# Patient Record
Sex: Male | Born: 1990 | Race: White | Hispanic: No | Marital: Single | Smoking: Never smoker
Health system: Southern US, Community
[De-identification: ages and names within clinical notes are randomized; demographics above are authoritative.]

## PROBLEM LIST (undated history)

## (undated) DIAGNOSIS — T7840XA Allergy, unspecified, initial encounter: Secondary | ICD-10-CM

## (undated) HISTORY — DX: Allergy, unspecified, initial encounter: T78.40XA

---

## 2013-05-23 ENCOUNTER — Ambulatory Visit: Payer: 59 | Admitting: Emergency Medicine

## 2013-05-23 VITALS — BP 100/66 | HR 97 | Temp 97.6°F | Resp 18 | Ht 74.75 in | Wt 144.0 lb

## 2013-05-23 DIAGNOSIS — L02219 Cutaneous abscess of trunk, unspecified: Secondary | ICD-10-CM

## 2013-05-23 DIAGNOSIS — L03319 Cellulitis of trunk, unspecified: Secondary | ICD-10-CM

## 2013-05-23 MED ORDER — SULFAMETHOXAZOLE-TRIMETHOPRIM 800-160 MG PO TABS: 1.0000 | ORAL_TABLET | Freq: Two times a day (BID) | ORAL | Status: DC

## 2013-05-23 NOTE — Progress Notes (Signed)
[facility] [address] [place] [address] [phone]  Date:  05/23/2013   Name:  William Gibbs   DOB:  12-Nov-1990   MRN:  [id number]  PCP:  No PCP Per Patient    Chief Complaint: Insect Bite   History of Present Illness:  William Gibbs is a 22 y.o. very pleasant male patient who presents with the following:  Bitten by an unknown insect on the left shoulder that was pruritic.  He noticed today some red streaks leading away from the bite.  No fever or chills.  No pain or tenderness.  No improvement with over the counter medications or other home remedies. Denies other complaint or health concern today.   There are no active problems to display for this patient.   Past Medical History  Diagnosis Date  . Allergy     History reviewed. No pertinent past surgical history.  History  Substance Use Topics  . Smoking status: Never Smoker   . Smokeless tobacco: Not on file  . Alcohol Use: No    History reviewed. No pertinent family history.  No Known Allergies  Medication list has been reviewed and updated.  No current outpatient prescriptions on file prior to visit.   No current facility-administered medications on file prior to visit.    Review of Systems:  As per HPI, otherwise negative.    Physical Examination: Filed Vitals:   05/23/13 0941  BP: 100/66  Pulse: 97  Temp: 97.6 F (36.4 C)  Resp: 18   Filed Vitals:   05/23/13 0941  Height: 6' 2.75" (1.899 m)  Weight: 144 lb (65.318 kg)   Body mass index is 18.11 kg/(m^2). Ideal Body Weight: Weight in (lb) to have BMI = 25: 198.3   GEN: WDWN, NAD, Non-toxic, Alert & Oriented x 3 HEENT: Atraumatic, Normocephalic.  Ears and Nose: No external deformity. EXTR: No clubbing/cyanosis/edema NEURO: Normal gait.  PSYCH: Normally interactive. Conversant. Not depressed or anxious appearing.  Calm demeanor.  SKIN:  Lymphangitis from erythematous tender mass on anterior left  shoulder.  Assessment and Plan: Cellulitis Septra ds Local heat  Signed,  Phillips Odor, MD

## 2013-05-23 NOTE — Patient Instructions (Signed)
Cellulitis Cellulitis is an infection of the skin and the tissue beneath it. The infected area is usually red and tender. Cellulitis occurs most often in the arms and lower legs.  CAUSES  Cellulitis is caused by bacteria that enter the skin through cracks or cuts in the skin. The most common types of bacteria that cause cellulitis are Staphylococcus and Streptococcus. SYMPTOMS   Redness and warmth.  Swelling.  Tenderness or pain.  Fever. DIAGNOSIS  Your caregiver can usually determine what is wrong based on a physical exam. Blood tests may also be done. TREATMENT  Treatment usually involves taking an antibiotic medicine. HOME CARE INSTRUCTIONS   Take your antibiotics as directed. Finish them even if you start to feel better.  Keep the infected arm or leg elevated to reduce swelling.  Apply a warm cloth to the affected area up to 4 times per day to relieve pain.  Only take over-the-counter or prescription medicines for pain, discomfort, or fever as directed by your caregiver.  Keep all follow-up appointments as directed by your caregiver. SEEK MEDICAL CARE IF:   You notice red streaks coming from the infected area.  Your red area gets larger or turns dark in color.  Your bone or joint underneath the infected area becomes painful after the skin has healed.  Your infection returns in the same area or another area.  You notice a swollen bump in the infected area.  You develop new symptoms. SEEK IMMEDIATE MEDICAL CARE IF:   You have a fever.  You feel very sleepy.  You develop vomiting or diarrhea.  You have a general ill feeling (malaise) with muscle aches and pains. MAKE SURE YOU:   Understand these instructions.  Will watch your condition.  Will get help right away if you are not doing well or get worse. Document Released: 07/14/2005 Document Revised: 04/04/2012 Document Reviewed: 12/20/2011 [employer] Patient Information 2014 [employer].  

## 2014-06-13 ENCOUNTER — Other Ambulatory Visit: Payer: Self-pay | Admitting: Orthopaedic Surgery

## 2014-06-13 DIAGNOSIS — M25532 Pain in left wrist: Secondary | ICD-10-CM

## 2014-10-18 ENCOUNTER — Emergency Department (HOSPITAL_COMMUNITY)
Admission: EM | Admit: 2014-10-18 | Discharge: 2014-10-18 | Disposition: A | Discharge status: Home / Self Care | Payer: 59 | Source: Home / Self Care | Attending: Emergency Medicine | Admitting: Emergency Medicine

## 2014-10-18 ENCOUNTER — Encounter (HOSPITAL_COMMUNITY): Payer: Self-pay | Admitting: Emergency Medicine

## 2014-10-18 DIAGNOSIS — J111 Influenza due to unidentified influenza virus with other respiratory manifestations: Secondary | ICD-10-CM

## 2014-10-18 DIAGNOSIS — R69 Illness, unspecified: Principal | ICD-10-CM

## 2014-10-18 MED ORDER — HYDROCOD POLST-CHLORPHEN POLST 10-8 MG/5ML PO LQCR: 5.0000 mL | Freq: Two times a day (BID) | ORAL | Status: DC | PRN

## 2014-10-18 MED ORDER — ALBUTEROL SULFATE HFA 108 (90 BASE) MCG/ACT IN AERS
2.0000 | INHALATION_SPRAY | Freq: Four times a day (QID) | RESPIRATORY_TRACT | Status: AC
Start: 2014-10-18 — End: ?

## 2014-10-18 MED ORDER — OSELTAMIVIR PHOSPHATE 75 MG PO CAPS: 75.0000 mg | ORAL_CAPSULE | Freq: Two times a day (BID) | ORAL | Status: DC

## 2014-10-18 MED ORDER — ALBUTEROL SULFATE HFA 108 (90 BASE) MCG/ACT IN AERS
2.0000 | INHALATION_SPRAY | Freq: Four times a day (QID) | RESPIRATORY_TRACT | Status: DC
Start: 2014-10-18 — End: 2014-10-18

## 2014-10-18 NOTE — ED Provider Notes (Signed)
   Chief Complaint   URI   History of Present Illness   William Gibbs is a 24 year old male who has had a four-day history beginning with malaise and fatigue. Then he developed a cough that was first dry then productive of yellow sputum. He's had some wheezing, fever to 101, chills, myalgias, sweats, sore throat, rhinorrhea, diarrhea and nausea and vomiting. He was exposed to a family member had something similar. He's not gotten the flu vaccine this year.  Review of Systems   Other than as noted above, the patient denies any of the following symptoms: Systemic:  No fevers, chills, sweats, or myalgias. Eye:  No redness or discharge. ENT:  No ear pain, headache, nasal congestion, drainage, sinus pressure, or sore throat. Neck:  No neck pain, stiffness, or swollen glands. Lungs:  No cough, sputum production, hemoptysis, wheezing, chest tightness, shortness of breath or chest pain. GI:  No abdominal pain, nausea, vomiting or diarrhea.  PMFSH   Past medical history, family history, social history, meds, and allergies were reviewed.   Physical exam   Vital signs:  BP 112/78 mmHg  Pulse 144  Temp(Src) 100.7 F (38.2 C) (Oral)  Resp 16  SpO2 99% General:  Alert and oriented.  In no distress.  Skin warm and dry. Eye:  No conjunctival injection or drainage. Lids were normal. ENT:  TMs and canals were normal, without erythema or inflammation.  Nasal mucosa was clear and uncongested, without drainage.  Mucous membranes were moist.  Pharynx was clear with no exudate or drainage.  There were no oral ulcerations or lesions. Neck:  Supple, no adenopathy, tenderness or mass. Lungs:  No respiratory distress.  There are scattered, bilateral inspiratory wheezes, no expiratory wheezes, no rales or rhonchi, good air movement bilaterally.  Heart:  Regular rhythm, without gallops, murmers or rubs. Skin:  Clear, warm, and dry, without rash or lesions.  Assessment     The encounter diagnosis was  Influenza-like illness.  There is no evidence of pneumonia, strep throat, sinusitis, otitis media.    Plan    1.  Meds:  The following meds were prescribed:   New Prescriptions   ALBUTEROL (PROVENTIL HFA;VENTOLIN HFA) 108 (90 BASE) MCG/ACT INHALER    Inhale 2 puffs into the lungs 4 (four) times daily.   CHLORPHENIRAMINE-HYDROCODONE (TUSSIONEX) 10-8 MG/5ML LQCR    Take 5 mLs by mouth every 12 (twelve) hours as needed for cough.   OSELTAMIVIR (TAMIFLU) 75 MG CAPSULE    Take 1 capsule (75 mg total) by mouth every 12 (twelve) hours.    2.  Patient Education/Counseling:  The patient was given appropriate handouts, self care instructions, and instructed in symptomatic relief.  Instructed to get extra fluids and extra rest.    3.  Follow up:  The patient was told to follow up here if no better in 3 to 4 days, or sooner if becoming worse in any way, and given some red flag symptoms such as increasing fever, difficulty breathing, chest pain, or persistent vomiting which would prompt immediate return.       Reuben Likes, MD 10/18/14 [phone]

## 2014-10-18 NOTE — Discharge Instructions (Signed)
Influenza Influenza ("the flu") is a viral infection of the respiratory tract. It occurs more often in winter months because people spend more time in close contact with one another. Influenza can make you feel very sick. Influenza easily spreads from person to person (contagious). CAUSES  Influenza is caused by a virus that infects the respiratory tract. You can catch the virus by breathing in droplets from an infected person's cough or sneeze. You can also catch the virus by touching something that was recently contaminated with the virus and then touching your mouth, nose, or eyes. RISKS AND COMPLICATIONS You may be at risk for a more severe case of influenza if you smoke cigarettes, have diabetes, have chronic heart disease (such as heart failure) or lung disease (such as asthma), or if you have a weakened immune system. Elderly people and pregnant women are also at risk for more serious infections. The most common problem of influenza is a lung infection (pneumonia). Sometimes, this problem can require emergency medical care and may be life threatening. SIGNS AND SYMPTOMS  Symptoms typically last 4 to 10 days and may include: 1. Fever. 2. Chills. 3. Headache, body aches, and muscle aches. 4. Sore throat. 5. Chest discomfort and cough. 6. Poor appetite. 7. Weakness or feeling tired. 8. Dizziness. 9. Nausea or vomiting. DIAGNOSIS  Diagnosis of influenza is often made based on your history and a physical exam. A nose or throat swab test can be done to confirm the diagnosis. TREATMENT  In mild cases, influenza goes away on its own. Treatment is directed at relieving symptoms. For more severe cases, your health care provider may prescribe antiviral medicines to shorten the sickness. Antibiotic medicines are not effective because the infection is caused by a virus, not by bacteria. HOME CARE INSTRUCTIONS 1. Take medicines only as directed by your health care provider. 2. Use a cool mist  humidifier to make breathing easier. 3. Get plenty of rest until your temperature returns to normal. This usually takes 3 to 4 days. 4. Drink enough fluid to keep your urine clear or pale yellow. 5. Cover yourmouth and nosewhen coughing or sneezing,and wash your handswellto prevent thevirusfrom spreading. 6. Stay homefromwork orschool untilthe fever is gonefor at least 23full day. PREVENTION  An annual influenza vaccination (flu shot) is the best way to avoid getting influenza. An annual flu shot is now routinely recommended for all adults in the [place] SEEK MEDICAL CARE IF:  You experiencechest pain, yourcough worsens,or you producemore mucus.  Youhave nausea,vomiting, ordiarrhea.  Your fever returns or gets worse. SEEK IMMEDIATE MEDICAL CARE IF:  You havetrouble breathing, you become short of breath,or your skin ornails becomebluish.  You have severe painor stiffnessin the neck.  You develop a sudden headache, or pain in the face or ear.  You have nausea or vomiting that you cannot control. MAKE SURE YOU:   Understand these instructions.  Will watch your condition.  Will get help right away if you are not doing well or get worse. Document Released: 10/01/2000 Document Revised: 02/18/2014 Document Reviewed: 01/03/2012 [facility] Patient Information 2015 [place], [place]. This information is not intended to replace advice given to you by your health care provider. Make sure you discuss any questions you have with your health care provider.    How to Use an Inhaler Proper inhaler technique is very important. Good technique ensures that the medicine reaches the lungs. Poor technique results in depositing the medicine on the tongue and back of the throat rather than in  the airways. If you do not use the inhaler with good technique, the medicine will not help you. STEPS TO FOLLOW IF USING AN INHALER WITHOUT AN EXTENSION TUBE 10. Remove the cap from the  inhaler. 11. If you are using the inhaler for the first time, you will need to prime it. Shake the inhaler for 5 seconds and release four puffs into the air, away from your face. Ask your health care provider or pharmacist if you have questions about priming your inhaler. 12. Shake the inhaler for 5 seconds before each breath in (inhalation). 13. Position the inhaler so that the top of the canister faces up. 14. Put your index finger on the top of the medicine canister. Your thumb supports the bottom of the inhaler. 15. Open your mouth. 16. Either place the inhaler between your teeth and place your lips tightly around the mouthpiece, or hold the inhaler 1-2 inches away from your open mouth. If you are unsure of which technique to use, ask your health care provider. 17. Breathe out (exhale) normally and as completely as possible. 18. Press the canister down with your index finger to release the medicine. 19. At the same time as the canister is pressed, inhale deeply and slowly until your lungs are completely filled. This should take 4-6 seconds. Keep your tongue down. 20. Hold the medicine in your lungs for 5-10 seconds (10 seconds is best). This helps the medicine get into the small airways of your lungs. 21. Breathe out slowly, through pursed lips. Whistling is an example of pursed lips. 22. Wait at least 15-30 seconds between puffs. Continue with the above steps until you have taken the number of puffs your health care provider has ordered. Do not use the inhaler more than your health care provider tells you. 23. Replace the cap on the inhaler. 24. Follow the directions from your health care provider or the inhaler insert for cleaning the inhaler. STEPS TO FOLLOW IF USING AN INHALER WITH AN EXTENSION (SPACER) 7. Remove the cap from the inhaler. 8. If you are using the inhaler for the first time, you will need to prime it. Shake the inhaler for 5 seconds and release four puffs into the air, away  from your face. Ask your health care provider or pharmacist if you have questions about priming your inhaler. 9. Shake the inhaler for 5 seconds before each breath in (inhalation). 10. Place the open end of the spacer onto the mouthpiece of the inhaler. 11. Position the inhaler so that the top of the canister faces up and the spacer mouthpiece faces you. 12. Put your index finger on the top of the medicine canister. Your thumb supports the bottom of the inhaler and the spacer. 13. Breathe out (exhale) normally and as completely as possible. 14. Immediately after exhaling, place the spacer between your teeth and into your mouth. Close your lips tightly around the spacer. 15. Press the canister down with your index finger to release the medicine. 16. At the same time as the canister is pressed, inhale deeply and slowly until your lungs are completely filled. This should take 4-6 seconds. Keep your tongue down and out of the way. 17. Hold the medicine in your lungs for 5-10 seconds (10 seconds is best). This helps the medicine get into the small airways of your lungs. Exhale. 18. Repeat inhaling deeply through the spacer mouthpiece. Again hold that breath for up to 10 seconds (10 seconds is best). Exhale slowly. If it is difficult  to take this second deep breath through the spacer, breathe normally several times through the spacer. Remove the spacer from your mouth. 19. Wait at least 15-30 seconds between puffs. Continue with the above steps until you have taken the number of puffs your health care provider has ordered. Do not use the inhaler more than your health care provider tells you. 20. Remove the spacer from the inhaler, and place the cap on the inhaler. 21. Follow the directions from your health care provider or the inhaler insert for cleaning the inhaler and spacer. If you are using different kinds of inhalers, use your quick relief medicine to open the airways 10-15 minutes before using a  steroid if instructed to do so by your health care provider. If you are unsure which inhalers to use and the order of using them, ask your health care provider, nurse, or respiratory therapist. If you are using a steroid inhaler, always rinse your mouth with water after your last puff, then gargle and spit out the water. Do not swallow the water. AVOID:  Inhaling before or after starting the spray of medicine. It takes practice to coordinate your breathing with triggering the spray.  Inhaling through the nose (rather than the mouth) when triggering the spray. HOW TO DETERMINE IF YOUR INHALER IS FULL OR NEARLY EMPTY You cannot know when an inhaler is empty by shaking it. A few inhalers are now being made with dose counters. Ask your health care provider for a prescription that has a dose counter if you feel you need that extra help. If your inhaler does not have a counter, ask your health care provider to help you determine the date you need to refill your inhaler. Write the refill date on a calendar or your inhaler canister. Refill your inhaler 7-10 days before it runs out. Be sure to keep an adequate supply of medicine. This includes making sure it is not expired, and that you have a spare inhaler.  SEEK MEDICAL CARE IF:   Your symptoms are only partially relieved with your inhaler.  You are having trouble using your inhaler.  You have some increase in phlegm. SEEK IMMEDIATE MEDICAL CARE IF:   You feel little or no relief with your inhalers. You are still wheezing and are feeling shortness of breath or tightness in your chest or both.  You have dizziness, headaches, or a fast heart rate.  You have chills, fever, or night sweats.  You have a noticeable increase in phlegm production, or there is blood in the phlegm. MAKE SURE YOU:   Understand these instructions.  Will watch your condition.  Will get help right away if you are not doing well or get worse. Document Released: 10/01/2000  Document Revised: 07/25/2013 Document Reviewed: 05/03/2013 [facility] Patient Information 2015 [place], [place]. This information is not intended to replace advice given to you by your health care provider. Make sure you discuss any questions you have with your health care provider.

## 2014-10-18 NOTE — ED Notes (Signed)
C/o cold sx onset Tuesday Sx include: fatigue, productive cough, fevers, HA, BA, chills, vomiting due to cough, wheezing, diarrhea Alert, no signs of acute distress.

## 2014-10-20 ENCOUNTER — Ambulatory Visit (HOSPITAL_COMMUNITY): Payer: 59 | Attending: Emergency Medicine

## 2014-10-20 ENCOUNTER — Encounter (HOSPITAL_COMMUNITY): Payer: Self-pay

## 2014-10-20 ENCOUNTER — Emergency Department (INDEPENDENT_AMBULATORY_CARE_PROVIDER_SITE_OTHER)
Admission: EM | Admit: 2014-10-20 | Discharge: 2014-10-20 | Disposition: A | Discharge status: Home / Self Care | Payer: 59 | Source: Home / Self Care | Attending: Family Medicine | Admitting: Family Medicine

## 2014-10-20 ENCOUNTER — Emergency Department (HOSPITAL_COMMUNITY): Payer: 59

## 2014-10-20 DIAGNOSIS — R05 Cough: Secondary | ICD-10-CM | POA: Diagnosis not present

## 2014-10-20 DIAGNOSIS — R509 Fever, unspecified: Secondary | ICD-10-CM | POA: Diagnosis not present

## 2014-10-20 DIAGNOSIS — R079 Chest pain, unspecified: Secondary | ICD-10-CM

## 2014-10-20 DIAGNOSIS — J189 Pneumonia, unspecified organism: Secondary | ICD-10-CM

## 2014-10-20 DIAGNOSIS — R0600 Dyspnea, unspecified: Secondary | ICD-10-CM | POA: Diagnosis not present

## 2014-10-20 DIAGNOSIS — R000 Tachycardia, unspecified: Secondary | ICD-10-CM

## 2014-10-20 DIAGNOSIS — J181 Lobar pneumonia, unspecified organism: Principal | ICD-10-CM

## 2014-10-20 DIAGNOSIS — R0989 Other specified symptoms and signs involving the circulatory and respiratory systems: Secondary | ICD-10-CM | POA: Insufficient documentation

## 2014-10-20 MED ORDER — AZITHROMYCIN 250 MG PO TABS: 250.0000 mg | ORAL_TABLET | Freq: Every day | ORAL | Status: AC

## 2014-10-20 MED ORDER — LIDOCAINE HCL (PF) 1 % IJ SOLN
INTRAMUSCULAR | Status: AC
Start: 2014-10-20 — End: 2014-10-20
  Filled 2014-10-20: qty 5

## 2014-10-20 MED ORDER — IBUPROFEN 800 MG PO TABS
ORAL_TABLET | ORAL | Status: AC
  Filled 2014-10-20: qty 1

## 2014-10-20 MED ORDER — SODIUM CHLORIDE 0.9 % IV SOLN
Freq: Once | INTRAVENOUS | Status: AC
  Administered 2014-10-20: 14:00:00 via INTRAVENOUS

## 2014-10-20 MED ORDER — CEFTRIAXONE SODIUM 1 G IJ SOLR
1.0000 g | Freq: Once | INTRAMUSCULAR | Status: AC
  Administered 2014-10-20: 1 g via INTRAMUSCULAR

## 2014-10-20 MED ORDER — AMOXICILLIN-POT CLAVULANATE 875-125 MG PO TABS: 1.0000 | ORAL_TABLET | Freq: Two times a day (BID) | ORAL | Status: AC

## 2014-10-20 MED ORDER — CEFTRIAXONE SODIUM 1 G IJ SOLR
INTRAMUSCULAR | Status: AC
  Filled 2014-10-20: qty 10

## 2014-10-20 MED ORDER — IBUPROFEN 800 MG PO TABS
800.0000 mg | ORAL_TABLET | Freq: Once | ORAL | Status: AC
  Administered 2014-10-20: 800 mg via ORAL

## 2014-10-20 NOTE — Discharge Instructions (Signed)
Pneumonia °Pneumonia is an infection of the lungs.  °CAUSES °Pneumonia may be caused by bacteria or a virus. Usually, these infections are caused by breathing infectious particles into the lungs (respiratory tract). °SIGNS AND SYMPTOMS  °· Cough. °· Fever. °· Chest pain. °· Increased rate of breathing. °· Wheezing. °· Mucus production. °DIAGNOSIS  °If you have the common symptoms of pneumonia, your health care provider will typically confirm the diagnosis with a chest X-ray. The X-ray will show an abnormality in the lung (pulmonary infiltrate) if you have pneumonia. Other tests of your blood, urine, or sputum may be done to find the specific cause of your pneumonia. Your health care provider may also do tests (blood gases or pulse oximetry) to see how well your lungs are working. °TREATMENT  °Some forms of pneumonia may be spread to other people when you cough or sneeze. You may be asked to wear a mask before and during your exam. Pneumonia that is caused by bacteria is treated with antibiotic medicine. Pneumonia that is caused by the influenza virus may be treated with an antiviral medicine. Most other viral infections must run their course. These infections will not respond to antibiotics.  °HOME CARE INSTRUCTIONS  °· Cough suppressants may be used if you are losing too much rest. However, coughing protects you by clearing your lungs. You should avoid using cough suppressants if you can. °· Your health care provider may have prescribed medicine if he or she thinks your pneumonia is caused by bacteria or influenza. Finish your medicine even if you start to feel better. °· Your health care provider may also prescribe an expectorant. This loosens the mucus to be coughed up. °· Take medicines only as directed by your health care provider. °· Do not smoke. Smoking is a common cause of bronchitis and can contribute to pneumonia. If you are a smoker and continue to smoke, your cough may last several weeks after your  pneumonia has cleared. °· A cold steam vaporizer or humidifier in your room or home may help loosen mucus. °· Coughing is often worse at night. Sleeping in a semi-upright position in a recliner or using a couple pillows under your head will help with this. °· Get rest as you feel it is needed. Your body will usually let you know when you need to rest. °PREVENTION °A pneumococcal shot (vaccine) is available to prevent a common bacterial cause of pneumonia. This is usually suggested for: °· People over 65 years old. °· Patients on chemotherapy. °· People with chronic lung problems, such as bronchitis or emphysema. °· People with immune system problems. °If you are over 65 or have a high risk condition, you may receive the pneumococcal vaccine if you have not received it before. In some countries, a routine influenza vaccine is also recommended. This vaccine can help prevent some cases of pneumonia. You may be offered the influenza vaccine as part of your care. °If you smoke, it is time to quit. You may receive instructions on how to stop smoking. Your health care provider can provide medicines and counseling to help you quit. °SEEK MEDICAL CARE IF: °You have a fever. °SEEK IMMEDIATE MEDICAL CARE IF:  °· Your illness becomes worse. This is especially true if you are elderly or weakened from any other disease. °· You cannot control your cough with suppressants and are losing sleep. °· You begin coughing up blood. °· You develop pain which is getting worse or is uncontrolled with medicines. °· Any of the symptoms   which initially brought you in for treatment are getting worse rather than better.  You develop shortness of breath or chest pain. MAKE SURE YOU:   Understand these instructions.  Will watch your condition.  Will get help right away if you are not doing well or get worse. Document Released: 10/04/2005 Document Revised: 02/18/2014 Document Reviewed: 12/24/2010 [facility] Patient Information 2015  [place], [place]. This information is not intended to replace advice given to you by your health care provider. Make sure you discuss any questions you have with your health care provider.  Chest Pain (Nonspecific) It is often hard to give a specific diagnosis for the cause of chest pain. There is always a chance that your pain could be related to something serious, such as a heart attack or a blood clot in the lungs. You need to follow up with your health care provider for further evaluation. CAUSES   Heartburn.  Pneumonia or bronchitis.  Anxiety or stress.  Inflammation around your heart (pericarditis) or lung (pleuritis or pleurisy).  A blood clot in the lung.  A collapsed lung (pneumothorax). It can develop suddenly on its own (spontaneous pneumothorax) or from trauma to the chest.  Shingles infection (herpes zoster virus). The chest wall is composed of bones, muscles, and cartilage. Any of these can be the source of the pain.  The bones can be bruised by injury.  The muscles or cartilage can be strained by coughing or overwork.  The cartilage can be affected by inflammation and become sore (costochondritis). DIAGNOSIS  Lab tests or other studies may be needed to find the cause of your pain. Your health care provider may have you take a test called an ambulatory electrocardiogram (ECG). An ECG records your heartbeat patterns over a 24-hour period. You may also have other tests, such as:  Transthoracic echocardiogram (TTE). During echocardiography, sound waves are used to evaluate how blood flows through your heart.  Transesophageal echocardiogram (TEE).  Cardiac monitoring. This allows your health care provider to monitor your heart rate and rhythm in real time.  Holter monitor. This is a portable device that records your heartbeat and can help diagnose heart arrhythmias. It allows your health care provider to track your heart activity for several days, if needed.  Stress tests  by exercise or by giving medicine that makes the heart beat faster. TREATMENT   Treatment depends on what may be causing your chest pain. Treatment may include:  Acid blockers for heartburn.  Anti-inflammatory medicine.  Pain medicine for inflammatory conditions.  Antibiotics if an infection is present.  You may be advised to change lifestyle habits. This includes stopping smoking and avoiding alcohol, caffeine, and chocolate.  You may be advised to keep your head raised (elevated) when sleeping. This reduces the chance of acid going backward from your stomach into your esophagus. Most of the time, nonspecific chest pain will improve within 2-3 days with rest and mild pain medicine.  HOME CARE INSTRUCTIONS   If antibiotics were prescribed, take them as directed. Finish them even if you start to feel better.  For the next few days, avoid physical activities that bring on chest pain. Continue physical activities as directed.  Do not use any tobacco products, including cigarettes, chewing tobacco, or electronic cigarettes.  Avoid drinking alcohol.  Only take medicine as directed by your health care provider.  Follow your health care provider's suggestions for further testing if your chest pain does not go away.  Keep any follow-up appointments you made. If  you do not go to an appointment, you could develop lasting (chronic) problems with pain. If there is any problem keeping an appointment, call to reschedule. SEEK MEDICAL CARE IF:   Your chest pain does not go away, even after treatment.  You have a rash with blisters on your chest.  You have a fever. SEEK IMMEDIATE MEDICAL CARE IF:   You have increased chest pain or pain that spreads to your arm, neck, jaw, back, or abdomen.  You have shortness of breath.  You have an increasing cough, or you cough up blood.  You have severe back or abdominal pain.  You feel nauseous or vomit.  You have severe weakness.  You  faint.  You have chills. This is an emergency. Do not wait to see if the pain will go away. Get medical help at once. Call your local emergency services (911 in [place] Do not drive yourself to the hospital. MAKE SURE YOU:   Understand these instructions.  Will watch your condition.  Will get help right away if you are not doing well or get worse. Document Released: 07/14/2005 Document Revised: 10/09/2013 Document Reviewed: 05/09/2008 [facility] Patient Information 2015 [place], [place]. This information is not intended to replace advice given to you by your health care provider. Make sure you discuss any questions you have with your health care provider.

## 2014-10-20 NOTE — ED Notes (Signed)
Called RX to [facility] at pt request. Spoke directly w pharmacist

## 2014-10-20 NOTE — ED Notes (Signed)
Recheck of respiratory illness. C/o feels as if he is not getting a good breath, hands and face feel numb. Cough continues, chest feels tight and sore

## 2014-10-20 NOTE — ED Provider Notes (Signed)
CSN: [id number]     Arrival date & time 10/20/14  1127 History   First MD Initiated Contact with Patient 10/20/14 1147     Chief Complaint  Patient presents with  . Shortness of Breath   (Consider location/radiation/quality/duration/timing/severity/associated sxs/prior Treatment) HPI Comments: 24 year old male was seen in the urgent care 2 days ago with URI symptoms accompanied by GI symptoms, fever and respiratory symptoms. He was treated with albuterol HFA, Tamiflu and Tussionex. He states that he continues to have a temperature of 101 last night and today. He states that he is having some shortness of breath associated with this limbs and hands tingling. He has noticed that he has been taking deeper breaths than usual recently. He is also complaining of vague chest pain. Pt is drinking fluids well. No vomiting.   Past Medical History  Diagnosis Date  . Allergy    History reviewed. No pertinent past surgical history. History reviewed. No pertinent family history. History  Substance Use Topics  . Smoking status: Never Smoker   . Smokeless tobacco: Not on file  . Alcohol Use: Yes    Review of Systems  Constitutional: Positive for fever, activity change and appetite change.  HENT: Positive for postnasal drip and sore throat. Negative for congestion.   Respiratory: Positive for shortness of breath and wheezing.   Cardiovascular: Positive for chest pain.  Gastrointestinal: Negative.   Skin: Negative for rash.  Neurological: Positive for dizziness and numbness.  Psychiatric/Behavioral: The patient is nervous/anxious.     Allergies  Review of patient's allergies indicates no known allergies.  Home Medications   Prior to Admission medications   Medication Sig Start Date End Date Taking? Authorizing Provider  albuterol (PROVENTIL HFA;VENTOLIN HFA) 108 (90 BASE) MCG/ACT inhaler Inhale 2 puffs into the lungs 4 (four) times daily. 10/18/14   Reuben Likes, MD  amoxicillin-clavulanate  (AUGMENTIN) 875-125 MG per tablet Take 1 tablet by mouth every 12 (twelve) hours. 10/20/14   Hayden Rasmussen, NP  azithromycin (ZITHROMAX) 250 MG tablet Take 1 tablet (250 mg total) by mouth daily. Take first 2 tablets together, then 1 every day until finished. 10/20/14   Hayden Rasmussen, NP  chlorpheniramine-HYDROcodone (TUSSIONEX) 10-8 MG/5ML LQCR Take 5 mLs by mouth every 12 (twelve) hours as needed for cough. 10/18/14   Reuben Likes, MD   BP 108/71 mmHg  Pulse 128  Temp(Src) 99.2 F (37.3 C) (Oral)  SpO2 100% Physical Exam  Constitutional: He is oriented to person, place, and time. He appears well-developed and well-nourished. No distress.  The patient does not appear to be in any physical distress but demonstrates elevated anxious behavior.   HENT:  Mouth/Throat: No oropharyngeal exudate.  Bilateral TMs are normal Oropharynx with minor erythema and clear PND. Patient will not allow to obtain a throat swab due to his "hyper gag reflex".  Eyes: Conjunctivae and EOM are normal.  Neck: Normal range of motion. Neck supple.  Cardiovascular: Regular rhythm and normal heart sounds.   Tachycardia  Pulmonary/Chest: Effort normal. No respiratory distress. He has no wheezes. He has no rales.  At rest patient is noted to take deep breaths and frequent sighs. Lungs perfectly clear, good expansion and air movement, normal I and E phases, no adventitious sounds.  Abdominal: Soft. There is no tenderness.  Lymphadenopathy:    He has cervical adenopathy.  Neurological: He is alert and oriented to person, place, and time. No cranial nerve deficit. He exhibits normal muscle tone.  Skin: Skin is warm and  dry. He is not diaphoretic.  Psychiatric: His speech is normal. His mood appears anxious. Cognition and memory are normal.  Nursing note and vitals reviewed.   ED Course  Procedures (including critical care time) Labs Review Labs Reviewed - No data to display  Imaging Review Dg Chest 2 View  10/20/2014    CLINICAL DATA:  Chest pain, fever, productive cough, chest congestion, and dyspnea.  EXAM: CHEST  2 VIEW  COMPARISON:  None.  FINDINGS: There are patchy infiltrates in the right middle lobe and in the left lower lobe. Heart size and vascularity are normal. No effusions. No osseous abnormality.  IMPRESSION: Bilateral pneumonia.   Electronically Signed   By: Geanie Cooley M.D.   On: 10/20/2014 12:48     MDM   1. Left lower lobe pneumonia   2. Dyspnea   3. Chest pain   4. Fever   5. Right middle lobe pneumonia   6. Tachycardia     CXR: as above EKG: sinus tachycardia. No ectopy. No ischemic changes. Intervals normal range. Pt st has a baseline HR of approx 100. Possibly tachy due to anxiety and /or low fluid volume. Has been instructed to  Obtain a PCP for evaluation . IV NS 1L for a total 750 cc. Rocephin 1 gm IM Azithromycin as dir Augmentin bid Ibuprofen  po now May stop Tamiflu. Stable on discharge although has developed a cough. Declines neb. Use your Albuterol for cough or wheeze and Robitussin DM.       Hayden Rasmussen, NP 10/20/14 1427

## 2014-10-23 ENCOUNTER — Encounter (HOSPITAL_COMMUNITY): Payer: Self-pay | Admitting: Emergency Medicine

## 2014-10-23 ENCOUNTER — Emergency Department (INDEPENDENT_AMBULATORY_CARE_PROVIDER_SITE_OTHER)
Admission: EM | Admit: 2014-10-23 | Discharge: 2014-10-23 | Disposition: A | Discharge status: Home / Self Care | Payer: Self-pay | Source: Home / Self Care | Attending: Family Medicine | Admitting: Family Medicine

## 2014-10-23 ENCOUNTER — Ambulatory Visit (HOSPITAL_COMMUNITY): Payer: Self-pay | Attending: Family Medicine

## 2014-10-23 DIAGNOSIS — J189 Pneumonia, unspecified organism: Secondary | ICD-10-CM | POA: Insufficient documentation

## 2014-10-23 DIAGNOSIS — R0781 Pleurodynia: Secondary | ICD-10-CM

## 2014-10-23 MED ORDER — BENZONATATE 100 MG PO CAPS: 100.0000 mg | ORAL_CAPSULE | Freq: Three times a day (TID) | ORAL | Status: AC

## 2014-10-23 MED ORDER — HYDROCODONE-ACETAMINOPHEN 5-325 MG PO TABS: 1.0000 | ORAL_TABLET | Freq: Four times a day (QID) | ORAL | Status: AC | PRN

## 2014-10-23 NOTE — Discharge Instructions (Signed)
Thank you for coming in today. Take hydrocodone for pain or coughing at bedtime. Use Tessalon during the day for cough as this will not make you drowsy. Do not drive after taking hydrocodone Take up to 2 Aleve twice daily for pain control Return as needed  Costochondritis Costochondritis, sometimes called Tietze syndrome, is a swelling and irritation (inflammation) of the tissue (cartilage) that connects your ribs with your breastbone (sternum). It causes pain in the chest and rib area. Costochondritis usually goes away on its own over time. It can take up to 6 weeks or longer to get better, especially if you are unable to limit your activities. CAUSES  Some cases of costochondritis have no known cause. Possible causes include:  Injury (trauma).  Exercise or activity such as lifting.  Severe coughing. SIGNS AND SYMPTOMS  Pain and tenderness in the chest and rib area.  Pain that gets worse when coughing or taking deep breaths.  Pain that gets worse with specific movements. DIAGNOSIS  Your health care provider will do a physical exam and ask about your symptoms. Chest X-rays or other tests may be done to rule out other problems. TREATMENT  Costochondritis usually goes away on its own over time. Your health care provider may prescribe medicine to help relieve pain. HOME CARE INSTRUCTIONS   Avoid exhausting physical activity. Try not to strain your ribs during normal activity. This would include any activities using chest, abdominal, and side muscles, especially if heavy weights are used.  Apply ice to the affected area for the first 2 days after the pain begins.  Put ice in a plastic bag.  Place a towel between your skin and the bag.  Leave the ice on for 20 minutes, 2-3 times a day.  Only take over-the-counter or prescription medicines as directed by your health care provider. SEEK MEDICAL CARE IF:  You have redness or swelling at the rib joints. These are signs of  infection.  Your pain does not go away despite rest or medicine. SEEK IMMEDIATE MEDICAL CARE IF:   Your pain increases or you are very uncomfortable.  You have shortness of breath or difficulty breathing.  You cough up blood.  You have worse chest pains, sweating, or vomiting.  You have a fever or persistent symptoms for more than 2-3 days.  You have a fever and your symptoms suddenly get worse. MAKE SURE YOU:   Understand these instructions.  Will watch your condition.  Will get help right away if you are not doing well or get worse. Document Released: 07/14/2005 Document Revised: 07/25/2013 Document Reviewed: 05/08/2013 [facility] Patient Information 2015 [place], [place]. This information is not intended to replace advice given to you by your health care provider. Make sure you discuss any questions you have with your health care provider.

## 2014-10-23 NOTE — ED Provider Notes (Signed)
William Gibbs is a 24 y.o. male who presents to Urgent Care today for left-sided rib pain. Patient was recently diagnosed with pneumonia. He's been coughing frequently. Yesterday evening he felt moderate left-sided rib pain. The pain is worse with cough and deep inspiration. No shortness of breath or fever. He notes since starting antibiotics he has felt much better overall.   Past Medical History  Diagnosis Date  . Allergy    History reviewed. No pertinent past surgical history. History  Substance Use Topics  . Smoking status: Never Smoker   . Smokeless tobacco: Not on file  . Alcohol Use: Yes   ROS as above Medications: No current facility-administered medications for this encounter.   Current Outpatient Prescriptions  Medication Sig Dispense Refill  . albuterol (PROVENTIL HFA;VENTOLIN HFA) 108 (90 BASE) MCG/ACT inhaler Inhale 2 puffs into the lungs 4 (four) times daily. 1 Inhaler 0  . amoxicillin-clavulanate (AUGMENTIN) 875-125 MG per tablet Take 1 tablet by mouth every 12 (twelve) hours. 14 tablet 0  . azithromycin (ZITHROMAX) 250 MG tablet Take 1 tablet (250 mg total) by mouth daily. Take first 2 tablets together, then 1 every day until finished. 6 tablet 0  . benzonatate (TESSALON) 100 MG capsule Take 1 capsule (100 mg total) by mouth every 8 (eight) hours. 30 capsule 0  . HYDROcodone-acetaminophen (NORCO/VICODIN) 5-325 MG per tablet Take 1 tablet by mouth every 6 (six) hours as needed (pain and cough). 15 tablet 0   No Known Allergies   Exam:  BP 120/82 mmHg  Pulse 113  Temp(Src) 98.2 F (36.8 C) (Oral)  Resp 16  SpO2 98% Gen: Well NAD HEENT: EOMI,  MMM Lungs: Normal work of breathing. CTABL Heart: Tachycardia no MRG Chest wall: Tender left chest wall no crepitations palpated Abd: NABS, Soft. Nondistended, Nontender Exts: Brisk capillary refill, warm and well perfused.   No results found for this or any previous visit (from the past 24 hour(s)). Dg Ribs  Unilateral W/chest Left  10/23/2014   CLINICAL DATA:  Pneumonia. Cough. Congestion. Left lower rib pain.  EXAM: LEFT RIBS AND CHEST - 3+ VIEW  COMPARISON:  10/20/2014  FINDINGS: Frontal view of the chest and three views of left-sided ribs. Midline trachea. Normal heart size and mediastinal contours. No pleural effusion or pneumothorax. Improvement in patchy bibasilar and lingular airspace disease. No new pulmonary opacities.  Rib films demonstrate no displaced fracture.  IMPRESSION: Improved multi focal pneumonia.  No displaced rib fracture, pleural effusion, or pneumothorax.   Electronically Signed   By: Jeronimo GreavesKyle  Talbot M.D.   On: 10/23/2014 12:17    Assessment and Plan: 24 y.o. male with rib pain/costochondritis. Treatment with NSAIDs Norco for cough suppression and pain and Tessalon for cough suppression at work  Discussed warning signs or symptoms. Please see discharge instructions. Patient expresses understanding.     Rodolph BongEvan S Katherinne Mofield, MD 10/23/14 [phone]

## 2014-10-23 NOTE — ED Notes (Signed)
Here for a f/u; dx w/pneumonia on 10/18/13 Concerned about left ribcage; poss fx due to coughing  Taking antibiotics and tolerating well Alert, no signs of acute distress.

## 2015-01-12 ENCOUNTER — Emergency Department (INDEPENDENT_AMBULATORY_CARE_PROVIDER_SITE_OTHER)
Admission: EM | Admit: 2015-01-12 | Discharge: 2015-01-12 | Disposition: A | Discharge status: Home / Self Care | Payer: 59 | Source: Home / Self Care | Attending: Family Medicine | Admitting: Family Medicine

## 2015-01-12 ENCOUNTER — Encounter (HOSPITAL_COMMUNITY): Payer: Self-pay

## 2015-01-12 DIAGNOSIS — R69 Illness, unspecified: Principal | ICD-10-CM

## 2015-01-12 DIAGNOSIS — J111 Influenza due to unidentified influenza virus with other respiratory manifestations: Secondary | ICD-10-CM | POA: Diagnosis not present

## 2015-01-12 MED ORDER — IPRATROPIUM BROMIDE 0.06 % NA SOLN: 2.0000 | Freq: Four times a day (QID) | NASAL | Status: AC

## 2015-01-12 MED ORDER — HYDROCOD POLST-CHLORPHEN POLST 10-8 MG/5ML PO LQCR: 5.0000 mL | Freq: Two times a day (BID) | ORAL | Status: AC | PRN

## 2015-01-12 NOTE — ED Provider Notes (Signed)
CSN: [id number]     Arrival date & time 01/12/15  1517 History   First MD Initiated Contact with Patient 01/12/15 1606     No chief complaint on file.  (Consider location/radiation/quality/duration/timing/severity/associated sxs/prior Treatment) Patient is a 24 y.o. male presenting with URI. The history is provided by the patient.  URI Presenting symptoms: congestion, cough, fever and rhinorrhea   Severity:  Mild Onset quality:  Gradual Duration:  4 days Progression:  Unchanged Chronicity:  New Relieved by:  None tried Ineffective treatments:  OTC medications Associated symptoms: no sneezing and no wheezing   Risk factors comment:  Concern re past pna in dec.   Past Medical History  Diagnosis Date  . Allergy    No past surgical history on file. No family history on file. History  Substance Use Topics  . Smoking status: Never Smoker   . Smokeless tobacco: Not on file  . Alcohol Use: Yes    Review of Systems  Constitutional: Positive for fever.  HENT: Positive for congestion, postnasal drip and rhinorrhea. Negative for sneezing.   Respiratory: Positive for cough. Negative for wheezing.     Allergies  Review of patient's allergies indicates no known allergies.  Home Medications   Prior to Admission medications   Medication Sig Start Date End Date Taking? Authorizing Provider  albuterol (PROVENTIL HFA;VENTOLIN HFA) 108 (90 BASE) MCG/ACT inhaler Inhale 2 puffs into the lungs 4 (four) times daily. 10/18/14   Reuben Likesavid C Keller, MD  amoxicillin-clavulanate (AUGMENTIN) 875-125 MG per tablet Take 1 tablet by mouth every 12 (twelve) hours. 10/20/14   Hayden Rasmussenavid Mabe, NP  azithromycin (ZITHROMAX) 250 MG tablet Take 1 tablet (250 mg total) by mouth daily. Take first 2 tablets together, then 1 every day until finished. 10/20/14   Hayden Rasmussenavid Mabe, NP  benzonatate (TESSALON) 100 MG capsule Take 1 capsule (100 mg total) by mouth every 8 (eight) hours. 10/23/14   Rodolph BongEvan S Corey, MD   chlorpheniramine-HYDROcodone (TUSSIONEX PENNKINETIC ER) 10-8 MG/5ML LQCR Take 5 mLs by mouth every 12 (twelve) hours as needed for cough. 01/12/15   Linna HoffJames D Kindl, MD  HYDROcodone-acetaminophen (NORCO/VICODIN) 5-325 MG per tablet Take 1 tablet by mouth every 6 (six) hours as needed (pain and cough). 10/23/14   Rodolph BongEvan S Corey, MD  ipratropium (ATROVENT) 0.06 % nasal spray Place 2 sprays into both nostrils 4 (four) times daily. 01/12/15   Linna HoffJames D Kindl, MD   BP 115/77 mmHg  Pulse 115  Temp(Src) 98.2 F (36.8 C) (Oral)  Resp 16  SpO2 99% Physical Exam  Constitutional: He is oriented to person, place, and time. He appears well-developed and well-nourished. No distress.  HENT:  Right Ear: External ear normal.  Left Ear: External ear normal.  Mouth/Throat: Oropharynx is clear and moist.  Eyes: Conjunctivae are normal. Pupils are equal, round, and reactive to light.  Neck: Normal range of motion. Neck supple.  Cardiovascular: Normal rate, normal heart sounds and intact distal pulses.   Pulmonary/Chest: Effort normal and breath sounds normal.  Neurological: He is alert and oriented to person, place, and time.  Skin: Skin is warm and dry.  Nursing note and vitals reviewed.   ED Course  Procedures (including critical care time) Labs Review Labs Reviewed - No data to display  Imaging Review No results found.   MDM   1. Influenza-like illness        Linna HoffJames D Kindl, MD 01/12/15 [phone]

## 2015-01-12 NOTE — ED Notes (Signed)
Concern for cough. Provider evaluation only

## 2015-01-31 ENCOUNTER — Ambulatory Visit (INDEPENDENT_AMBULATORY_CARE_PROVIDER_SITE_OTHER): Payer: 59 | Admitting: Internal Medicine

## 2015-01-31 DIAGNOSIS — Z7189 Other specified counseling: Secondary | ICD-10-CM

## 2015-01-31 DIAGNOSIS — Z7184 Encounter for health counseling related to travel: Secondary | ICD-10-CM | POA: Insufficient documentation

## 2015-01-31 DIAGNOSIS — Z23 Encounter for immunization: Secondary | ICD-10-CM | POA: Diagnosis not present

## 2015-01-31 MED ORDER — ATOVAQUONE-PROGUANIL HCL 250-100 MG PO TABS: 1.0000 | ORAL_TABLET | Freq: Every day | ORAL | Status: AC

## 2015-01-31 MED ORDER — CIPROFLOXACIN HCL 500 MG PO TABS
500.0000 mg | ORAL_TABLET | Freq: Two times a day (BID) | ORAL | Status: AC
Start: 2015-01-31 — End: ?

## 2015-01-31 MED ORDER — TYPHOID VACCINE PO CPDR: 1.0000 | DELAYED_RELEASE_CAPSULE | ORAL | Status: AC

## 2015-01-31 MED ORDER — ACETAZOLAMIDE 125 MG PO TABS: 125.0000 mg | ORAL_TABLET | Freq: Two times a day (BID) | ORAL | Status: AC

## 2015-01-31 NOTE — Progress Notes (Signed)
Subjective:   Huel Cotethan Hurlbutt is a 24 y.o. male who presents to the Infectious Disease clinic for travel consultation. Planned departure date: May 21st          Planned return date: 2 weeks Countries of travel: [place] Areas in country: rural and urban   Accommodations: hotel Purpose of travel: vacation Prior travel out of [place]: yes     Objective:   Medications: none    Assessment:    No contraindications to travel. none     Plan:    Issues discussed: altitude illness, environmental concerns, future shots, insect-borne illnesses, malaria, motion sickness, MVA safety, rabies, safe food/water, traveler's diarrhea, website/handouts for more information, what to do if ill upon return, what to do if ill while there and Yellow Fever. Immunizations recommended: Hepatitis A series, Td, Typhoid (oral) and Yellow Fever. Malaria prophylaxis: malarone, daily dose starting 1-2 days before entering endemic area, ending 7 days after leaving area Traveler's diarrhea prophylaxis: ciprofloxacin. Total duration of visit: 1 Hour. Total time spent on education, counseling, coordination of care: 30 Minutes.

## 2015-01-31 NOTE — Patient Instructions (Signed)
[facility]                [address], [address]                   [place], [place] [address]                      Phone: [phone]                        Fax: [phone]   Planned departure date: May 21st          Planned return date: 2 weeks Countries of travel: [place]   Guidelines for the Prevention & Treatment of Traveler's Diarrhea  Prevention: "Boil it, Peel it, [place] it, or Forget it"   the fewer chances -> lower risk: try to stick to food & water precautions as much as possible"   If it's "piping hot"; it is probably okay, if not, it may not be   Treatment   1) You should always take care to drink lots of fluids in order to avoid dehydration   2) You should bring medications with you in case you come down with a case of diarrhea   3) OTC = bring pepto-bismol - can take with initial abdominal symptoms;                    Imodium - can help slow down your intestinal tract, can help relief cramps                    and diarrhea, can take if no bloody diarrhea  Use ciprofloxacin if needed for traveler's diarrhea  Guidelines for the Prevention of Malaria  Avoidance:  -fewer mosquito bites = lower risk. Mosquitos can bite at night as well as daytime  -cover up (long sleeve clothing), mosquito nets, screens  -Insect repellent for your skin ( DEET containing lotion > 20%): for clothes ( permethrin spray)   On 2 days prior to travel to [place], start [place], daily dose starting 1-2 days before entering endemic area, ending 7 days after leaving area for malaria prevention.  *Try Malarone 2 days each to be sure it is tolerated (wait for 72 hours after you have finished oral typhoid vaccine pills due to vaccine side effects) *   Immunizations received today: Hepatitis A series, Td, Typhoid (oral) and Yellow Fever  Future immunizations, if indicated Hepatitis A series in 6 months   Prior to travel:  1) Be sure  to pick up appropriate prescriptions, including medicine you take daily. Do not expect to be able to fill your prescriptions abroad.  2) Strongly consider obtaining traveler's insurance, including emergency evacuation insurance. Most plans in the [place] do not cover participants abroad. (see below for resources)  3) Register at the appropriate [place] embassy or consulate with travel dates so they are aware of your presence in-country and for helpful advice during travel using the [employer] (STEP, GuyGalaxy.si).  4) Leave contact information with a relative or friend.  5) Keep a Corporate treasurer, credit cards in case they become lost or stolen  6) Inform your credit card company that you will be travelling abroad   During travel:  1) If you become ill and need medical advice, the [place] [employer] of the country you are traveling in general provides a  list of English speaking doctors.  We are also available on MyChart for remote consultation if you register prior to travel. 2) Avoid motorcycles or scooters when at all possible. Traffic laws in many countries are lax and accidents occur frequently.  3) Do not take any unnecessary risks that you wouldn't do at home.   Resources:  -Country specific information: www.cdc.gov/travel or GuyGalaxy.sihttps://step.state.gov/step  -Chief Strategy OfficerTravel Supplies (http://www.church.org/DEET, mosquito nets): [employer], [employer] store, [employer], [employer]  -Travel insurance options: gatewayplans.com; FixItGel.esmedexassist.com; travelguard.com or [employer], gninsurance.com or info@gninsurance .com, 954-420-7928415-609-8348.   Post Travel:  If you return from your trip ill, call your primary care doctor or our travel clinic @ [phone].   Enjoy your trip and know that with proper pre-travel preparation, most people have an enjoyable and uninterrupted trip!

## 2015-02-03 NOTE — Addendum Note (Signed)
Addended by: Andree CossHOWELL, Jettie Mannor M on: 02/03/2015 08:36 AM   Modules accepted: Orders

## 2015-05-28 IMAGING — DX DG RIBS W/ CHEST 3+V*L*
4 series · 4 of 4 positions shown · non-contrast
Comparison: 10/20/2014

CLINICAL DATA: Pneumonia. Cough. Congestion. Left lower rib pain.

EXAM:
LEFT RIBS AND CHEST - 3+ VIEW

[chest pa]
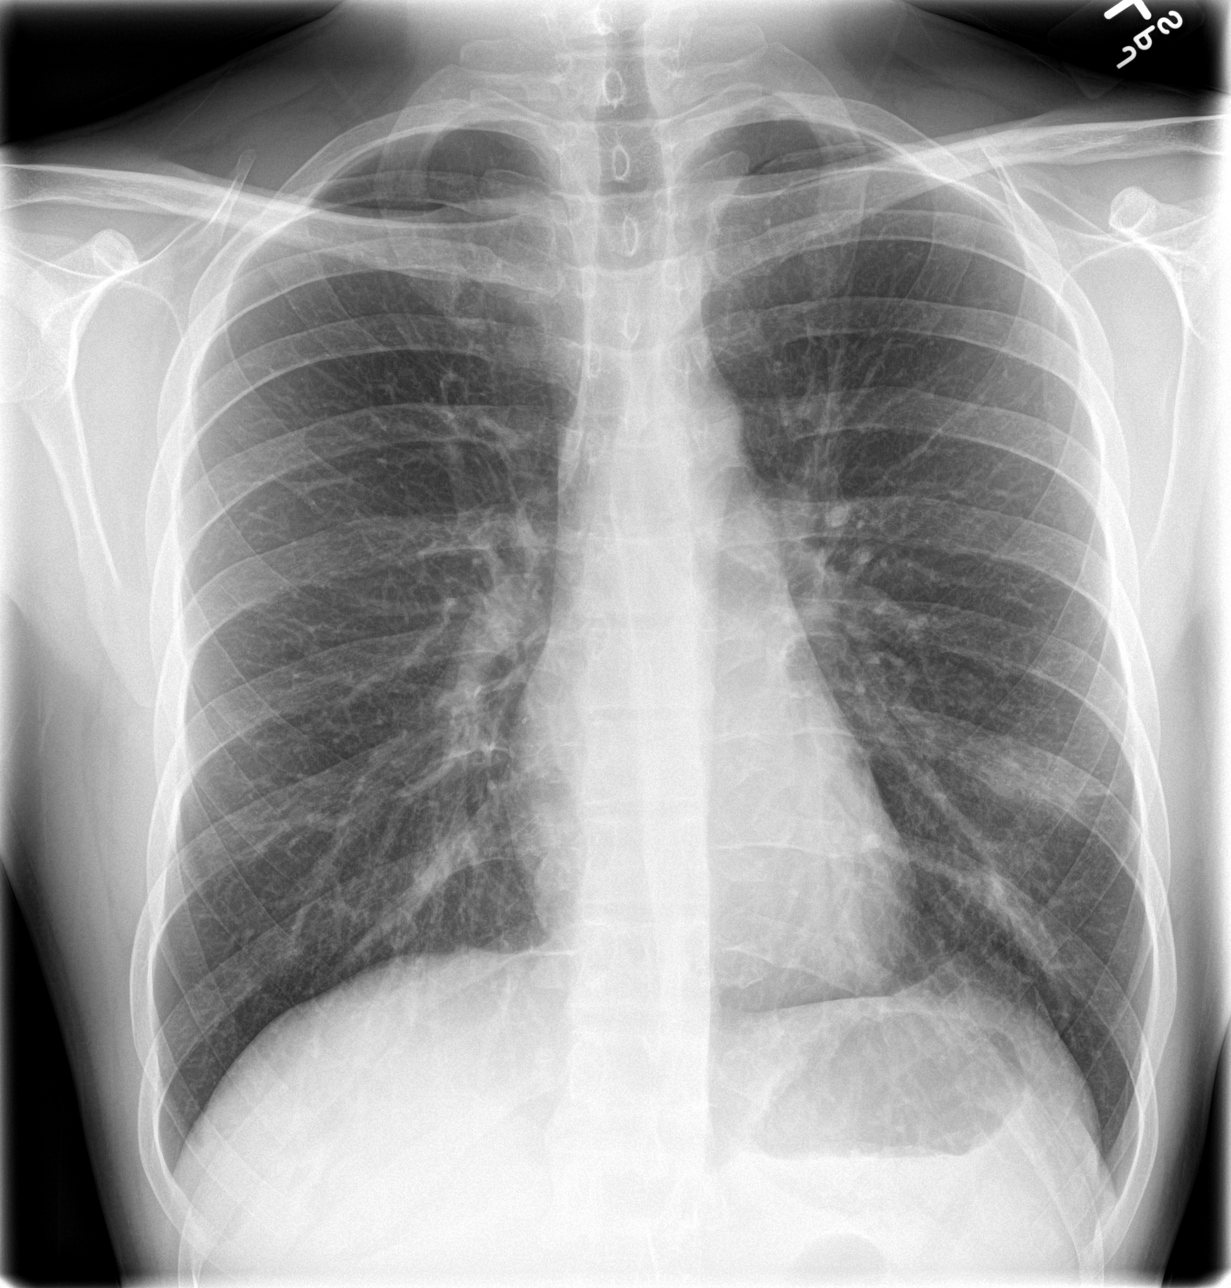

[rib ap (1 of 2)]
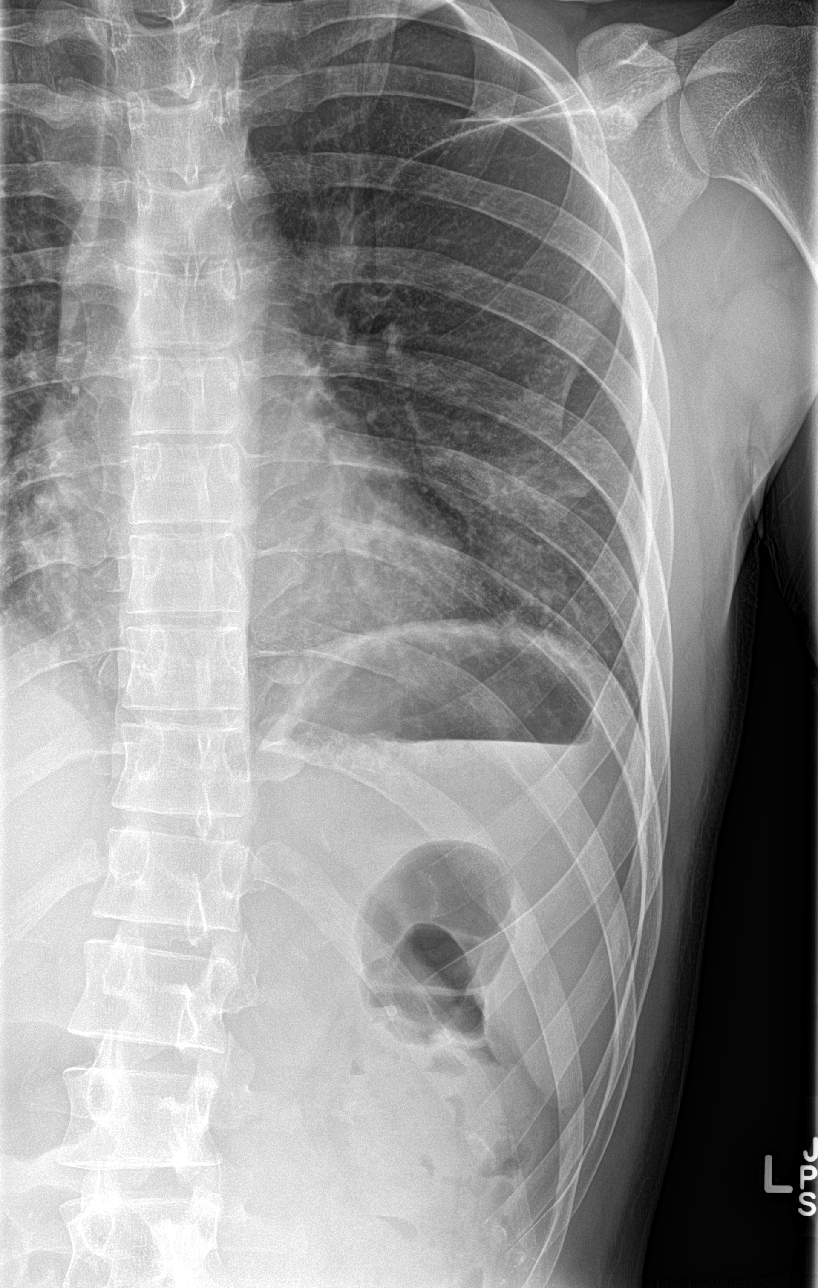

[rib ap (2 of 2)]
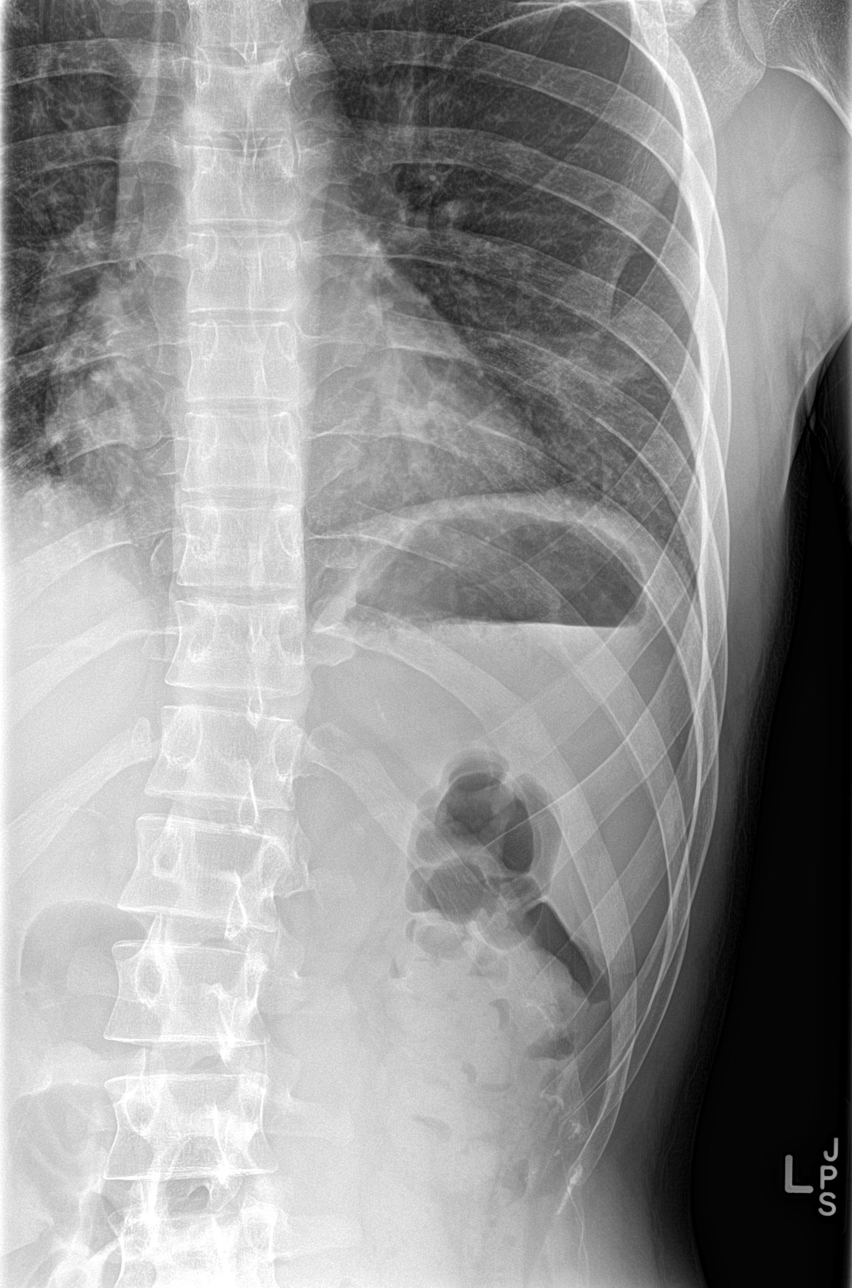

[rib obl]
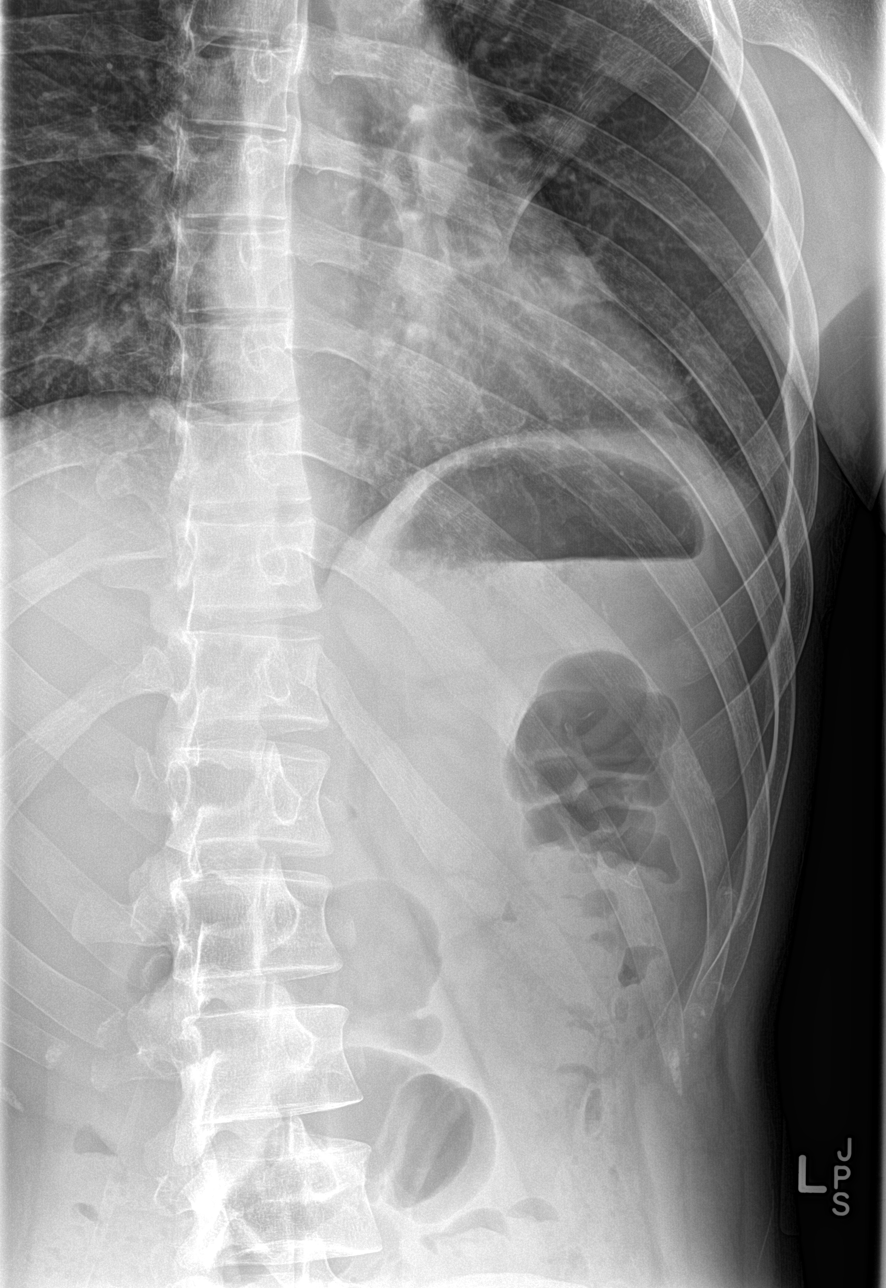

[4 of 4 positions shown; findings below may reference images not displayed]

FINDINGS: Frontal view of the chest and three views of left-sided ribs.
Midline trachea. Normal heart size and mediastinal contours. No
pleural effusion or pneumothorax. Improvement in patchy bibasilar
and lingular airspace disease. No new pulmonary opacities.

Rib films demonstrate no displaced fracture.
IMPRESSION: Improved multi focal pneumonia.

No displaced rib fracture, pleural effusion, or pneumothorax.
